# Patient Record
Sex: Male | Born: 1955 | Race: White | Hispanic: No | Smoking: Never smoker
Health system: Southern US, Community
[De-identification: ages and names within clinical notes are randomized; demographics above are authoritative.]

## PROBLEM LIST (undated history)

## (undated) DIAGNOSIS — E785 Hyperlipidemia, unspecified: Secondary | ICD-10-CM

## (undated) DIAGNOSIS — I10 Essential (primary) hypertension: Secondary | ICD-10-CM

## (undated) HISTORY — DX: Hyperlipidemia, unspecified: E78.5

## (undated) HISTORY — DX: Essential (primary) hypertension: I10

---

## 2002-09-06 ENCOUNTER — Ambulatory Visit (HOSPITAL_BASED_OUTPATIENT_CLINIC_OR_DEPARTMENT_OTHER): Admission: RE | Admit: 2002-09-06 | Discharge: 2002-09-06 | Payer: Self-pay | Admitting: General Surgery

## 2005-05-28 ENCOUNTER — Encounter: Admission: RE | Admit: 2005-05-28 | Discharge: 2005-05-28 | Payer: Self-pay | Admitting: Orthopedic Surgery

## 2005-09-20 ENCOUNTER — Ambulatory Visit: Payer: Self-pay | Admitting: Family Medicine

## 2005-11-21 ENCOUNTER — Ambulatory Visit: Payer: Self-pay | Admitting: Family Medicine

## 2006-07-11 ENCOUNTER — Ambulatory Visit: Payer: Self-pay | Admitting: Family Medicine

## 2006-07-11 LAB — CONVERTED CEMR LAB
ALT: 23 units/L (ref 0–40)
AST: 18 units/L (ref 0–37)
CO2: 30 meq/L (ref 19–32)
Cholesterol: 158 mg/dL (ref 0–200)
GFR calc non Af Amer: 69 mL/min
Glomerular Filtration Rate, Af Am: 83 mL/min/{1.73_m2}
Glucose, Bld: 100 mg/dL — ABNORMAL HIGH (ref 70–99)
LDL Cholesterol: 96 mg/dL (ref 0–99)
Lymphocytes Relative: 33.2 % (ref 12.0–46.0)
MCV: 98.2 fL (ref 78.0–100.0)
Neutrophils Relative %: 55.4 % (ref 43.0–77.0)
PSA: 0.71 ng/mL (ref 0.10–4.00)
Platelets: 278 10*3/uL (ref 150–400)
Potassium: 4.2 meq/L (ref 3.5–5.1)
TSH: 2.19 microintl units/mL (ref 0.35–5.50)
Triglyceride fasting, serum: 168 mg/dL — ABNORMAL HIGH (ref 0–149)
VLDL: 34 mg/dL (ref 0–40)

## 2006-08-11 ENCOUNTER — Ambulatory Visit: Payer: Self-pay | Admitting: Family Medicine

## 2006-09-09 ENCOUNTER — Ambulatory Visit: Payer: Self-pay | Admitting: Family Medicine

## 2007-11-18 ENCOUNTER — Encounter: Payer: Self-pay | Admitting: Family Medicine

## 2008-06-29 ENCOUNTER — Telehealth: Payer: Self-pay | Admitting: *Deleted

## 2008-09-19 ENCOUNTER — Telehealth: Payer: Self-pay | Admitting: *Deleted

## 2008-10-20 ENCOUNTER — Ambulatory Visit: Payer: Self-pay | Admitting: Family Medicine

## 2008-10-20 LAB — CONVERTED CEMR LAB
ALT: 22 units/L (ref 0–53)
AST: 24 units/L (ref 0–37)
Albumin: 4 g/dL (ref 3.5–5.2)
Basophils Relative: 0.6 % (ref 0.0–3.0)
CO2: 30 meq/L (ref 19–32)
Eosinophils Relative: 3.6 % (ref 0.0–5.0)
GFR calc non Af Amer: 84 mL/min
HDL: 30.7 mg/dL — ABNORMAL LOW (ref >39.0)
Hemoglobin: 15.5 g/dL (ref 13.0–17.0)
Ketones, urine, test strip: NEGATIVE
LDL Cholesterol: 93 mg/dL (ref 0–99)
Lymphocytes Relative: 27.2 % (ref 12.0–46.0)
MCHC: 35 g/dL (ref 30.0–36.0)
Monocytes Absolute: 0.7 10*3/uL (ref 0.1–1.0)
Monocytes Relative: 10 % (ref 3.0–12.0)
Neutro Abs: 4.1 10*3/uL (ref 1.4–7.7)
Nitrite: NEGATIVE
RDW: 11.6 % (ref 11.5–14.6)
Specific Gravity, Urine: 1.02
Total CHOL/HDL Ratio: 5.1
Triglycerides: 174 mg/dL — ABNORMAL HIGH (ref 0–149)
Urobilinogen, UA: 0.2

## 2008-10-27 ENCOUNTER — Ambulatory Visit: Payer: Self-pay | Admitting: Family Medicine

## 2008-10-27 DIAGNOSIS — M1710 Unilateral primary osteoarthritis, unspecified knee: Secondary | ICD-10-CM

## 2008-10-27 DIAGNOSIS — I10 Essential (primary) hypertension: Secondary | ICD-10-CM | POA: Insufficient documentation

## 2008-10-27 DIAGNOSIS — E785 Hyperlipidemia, unspecified: Secondary | ICD-10-CM | POA: Insufficient documentation

## 2008-11-08 ENCOUNTER — Ambulatory Visit: Payer: Self-pay | Admitting: Gastroenterology

## 2008-11-21 ENCOUNTER — Encounter: Payer: Self-pay | Admitting: Family Medicine

## 2008-11-21 ENCOUNTER — Ambulatory Visit: Payer: Self-pay | Admitting: Internal Medicine

## 2008-11-22 ENCOUNTER — Ambulatory Visit: Payer: Self-pay | Admitting: Gastroenterology

## 2009-11-03 ENCOUNTER — Ambulatory Visit: Payer: Self-pay | Admitting: Family Medicine

## 2009-11-03 DIAGNOSIS — N529 Male erectile dysfunction, unspecified: Secondary | ICD-10-CM

## 2009-11-03 LAB — CONVERTED CEMR LAB: Testosterone: 387 ng/dL (ref 350–890)

## 2010-11-06 NOTE — Assessment & Plan Note (Signed)
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  Nonsmoker, who comes in today for evaluation of one years history of decrease in sexual function.  He said no history of trauma has otherwise been well.  He takes lisinopril -- HCTZ 20 -- 25 daily for mild hypertension.  BP 140/98.  BP at home, normal.  Allergies: No Known Drug Allergies  Past History:  Past medical, surgical, family and social histories (including risk factors) reviewed, and no changes noted (except as noted below).  Past Medical History: Reviewed history from 10/27/2008 and no changes required. Hyperlipidemia Hypertension  Family History: Reviewed history from 10/27/2008 and no changes required. mom and dad.  No coronary disease, brother Jorja Loa in excellent health.  No coronary disease  Social History: Reviewed history from 10/27/2008 and no changes required. Occupation: Married Never Smoked Alcohol use-no Drug use-no Regular exercise-no  Review of Systems      See HPI  Physical Exam  General:  Well-developed,well-nourished,in no acute distress; alert,appropriate and cooperative throughout examination Genitalia:  Testes bilaterally descended without nodularity, tenderness or masses. No scrotal masses or lesions. No penis lesions or urethral discharge.   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 Take 1 tablet by mouth once a day 2)  Niaspan 500 Mg Tbcr (Niacin (antihyperlipidemic)) .... Take 1 tablet by mouth once a day ;cpx or ov to rf meds  Patient Instructions: 1)  I will call you and I get your lab work back. 2)  Remember this set up for y  annual exam in June

## 2010-12-18 ENCOUNTER — Ambulatory Visit
Admission: RE | Admit: 2010-12-18 | Discharge: 2010-12-18 | Disposition: A | Discharge status: Home / Self Care | Payer: BC Managed Care – PPO | Source: Ambulatory Visit | Attending: Orthopedic Surgery | Admitting: Orthopedic Surgery

## 2010-12-18 ENCOUNTER — Other Ambulatory Visit: Payer: Self-pay | Admitting: Orthopedic Surgery

## 2010-12-18 DIAGNOSIS — R52 Pain, unspecified: Secondary | ICD-10-CM

## 2010-12-18 DIAGNOSIS — Z01818 Encounter for other preprocedural examination: Secondary | ICD-10-CM

## 2010-12-20 ENCOUNTER — Ambulatory Visit (HOSPITAL_BASED_OUTPATIENT_CLINIC_OR_DEPARTMENT_OTHER)
Admission: RE | Admit: 2010-12-20 | Discharge: 2010-12-20 | Disposition: A | Discharge status: Home / Self Care | Payer: BC Managed Care – PPO | Source: Ambulatory Visit | Attending: Orthopedic Surgery | Admitting: Orthopedic Surgery

## 2010-12-20 DIAGNOSIS — Q740 Other congenital malformations of upper limb(s), including shoulder girdle: Secondary | ICD-10-CM | POA: Insufficient documentation

## 2010-12-20 DIAGNOSIS — M948X9 Other specified disorders of cartilage, unspecified sites: Secondary | ICD-10-CM | POA: Insufficient documentation

## 2010-12-21 ENCOUNTER — Ambulatory Visit: Payer: BC Managed Care – PPO | Attending: Radiation Oncology | Admitting: Radiation Oncology

## 2010-12-21 DIAGNOSIS — Z510 Encounter for antineoplastic radiation therapy: Secondary | ICD-10-CM | POA: Insufficient documentation

## 2010-12-21 DIAGNOSIS — Z79899 Other long term (current) drug therapy: Secondary | ICD-10-CM | POA: Insufficient documentation

## 2010-12-21 DIAGNOSIS — M6140 Other calcification of muscle, unspecified site: Secondary | ICD-10-CM | POA: Insufficient documentation

## 2010-12-21 DIAGNOSIS — I10 Essential (primary) hypertension: Secondary | ICD-10-CM | POA: Insufficient documentation

## 2010-12-27 NOTE — Op Note (Signed)
**Note De-Identified Arauz Obfuscation** NAMESHAYLON, ADEN NO.:  [id number]  MEDICAL RECORD NO.:  [id number]           PATIENT TYPE:  LOCATION:                                 FACILITY:  PHYSICIAN:  Loreta Ave, M.D.      DATE OF BIRTH:  DATE OF PROCEDURE:  12/20/2010 DATE OF DISCHARGE:                              OPERATIVE REPORT   PREOPERATIVE DIAGNOSES:  Left elbow previous distal biceps reimplantation, two-incision technique.  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  POSTOPERATIVE DIAGNOSES:  Left elbow previous distal biceps reimplantation, two-incision technique.  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  PROCEDURE:  Left elbow excision of functional synostosis with osteotomy and removal of all heterotopic bone from radius and ulna.  Restoration of full motion.  SURGEON:  Loreta Ave, MD  ASSISTANT:  Genene Churn. Barry Dienes, [place], present throughout the entire case and necessary for timely completion of procedure.  ANESTHESIA:  General.  BLOOD LOSS:  Minimal.  SPECIMENS:  None.  CULTURES:  None.  COMPLICATIONS:  None.  DRESSING:  Soft compressive.  TOURNIQUET TIME:  One hour and 15 minutes.  PROCEDURE:  The patient was brought to the operating room and placed on the operating table in supine position.  After adequate anesthesia had been obtained, a tourniquet was applied to the left arm.  Prepped and draped in the usual sterile fashion.  Exsanguinated with elevation of Esmarch, tourniquet was inflated to 250 mmHg.  He has full flexion and extension of his elbow.  Pronation 60 degrees, supination virtually 0. I utilized the dorsal incision proximal forearm over the supinator muscle.  Skin and subcutaneous tissue were divided.  The muscle was split exposing the area of synostosis.  I first removed all of  the heterotopic bone off the ulna down to the native shaft.  This improved motion to an extend, but there was a much larger area of heterotopic bone on the radius.  This was carefully exposed in its entirety and removed down to the level of the radial shaft throughout.  Care was taken to protect the radial nerve distally.  Once all bone had been completely removed, I got fluoroscopic views to confirm this.  I could then bring him through absolutely full motion with full flexion and extension of the elbow, full pronation and supination passively very easily.  At the time of excision of the heterotopic bone from the radius, the FiberWire suture, that was evident coming out where the heterotopic bone was, was pulled out and removed as his distal biceps repair was already intact and functional.  The wound was then thoroughly irrigated.  There was really not a place where I could apply a fat graft between the radius because of the motion between the two bones at that site.  The fascia supinator were closed with Vicryl and skin was closed with nylon.  Sterile compressive dressing was applied.  Tourniquet was deflated and removed.  Anesthesia was reversed.  Brought to **Note De-Identified Maes Obfuscation** the recovery room.  Tolerated the surgery well.  No complications.     Loreta Ave, M.D.     DFM/MEDQ  D:  12/20/2010  T:  12/21/2010  Job:  [id number]  Electronically Signed by Mckinley Jewel M.D. on 12/27/2010 11:48:44 AM

## 2011-02-22 NOTE — Op Note (Signed)
**Note De-Identified Swaminathan Obfuscation** NAME:  Justin [name], Justin [name]                            ACCOUNT NO.:  [id number]   MEDICAL RECORD NO.:  [id number]                   PATIENT TYPE:  AMB   LOCATION:  DSC                                  FACILITY:  [facility]   PHYSICIAN:  Jimmye Norman III, M.D.               DATE OF BIRTH:  11-Aug-1956   DATE OF PROCEDURE:  09/06/2002  DATE OF DISCHARGE:                                 OPERATIVE REPORT   PREOPERATIVE DIAGNOSIS:  Nonhealing posterior anal fissure.   POSTOPERATIVE DIAGNOSIS:  Nonhealing posterior anal fissure.   PROCEDURE:  1. Rigid sigmoidoscopy.  2. Anoscopy.  3. Examination under  anesthesia.  4. Lateral sphincterotomy.  5. Fissurectomy.   SURGEON:  Jimmye Norman, M.D.   ASSISTANT:  None.   ANESTHESIA:  General endotracheal anesthesia.   ESTIMATED BLOOD LOSS:  Less than 100 cc.   COMPLICATIONS:  None, condition stable.   INDICATIONS FOR PROCEDURE:  The patient is [name] 55 year old with [name] nonhealing  posterior anal fissure who has failed conservative management who now comes  in for definitive treatment.   FINDINGS:  The patient had [name] moderately scarred posterior anal fissure with  exposed but uninjured posterior anal sphincter muscle. The sphincter was cut  laterally along the left side of the superficial anal sphincter only.   OPERATION:  The patient was taken to the operating room and placed in the  supine position on the operating table. After an adequate endotracheal  anesthetic was administered, he was slipped into the jackknife prone  position. His buttocks was taped laterally and shaved around the anal area.   Initially the rigid sigmoidoscopy was done up to 22 cm. There were no polyps  or masses noted. We removed that and then subsequently did [name] digital  examination which demonstrated the fissure posteriorly, which was at the 12  o'clock position by description with an intact sphincter. We could see  exposed sphincter muscle in that area, but it was not injured  and not torn.   An anoscopy was subsequently performed which demonstrated the length of the  fissure measuring approximately 2 cm in length extending across the  superficial external sphincter. We excised the mucosal margins of the  excision and subsequently curetted lightly over the scar base, allowing for  bleeding. We then did [name] running, locking stitch of 3-0 chromic from the apex  of the cut mucosa out to the skin edge which was left open about 1cm in  size. This whole  area was about 3 cm after we excised the mucosal margin.   We subsequently on the left lateral aspect and midway between the anterior  and  posterior aspect of  the anal sphincter, we made [name] mucosal cut using [name]  #15 blade  and then subsequently isolated out the superficial external  sphincter at that area, undermining it with [name] hemostat clamp and then  cutting across using **Note De-Identified Boghosian Obfuscation** electrocautery. The mucosa was closed on top of that  using [name] running locking stitch of 3-0 chromic.   We irrigated with saline solution and then subsequently towels with  bupivacaine soaked with Gelfoam into the anal area for hemostasis and pain  control. We also injected circumferentially with half strength Marcaine into  the anal sphincter area. Once this was done, we put [name] dressing of fluff for  control of bleeding.                                               Kathrin Ruddy, M.D.    JW/MEDQ  D:  09/06/2002  T:  09/06/2002  Job:  [id number]

## 2011-03-07 ENCOUNTER — Other Ambulatory Visit: Payer: Self-pay | Admitting: Family Medicine

## 2011-03-07 NOTE — Telephone Encounter (Signed)
**Note De-identified Swalley Obfuscation** Time for an office visit 

## 2011-08-12 IMAGING — CT CT ELBOW*L* W/O CM
2 of 4 series · 4 of 14 positions shown, 5 images · non-contrast
Comparison: None.

CLINICAL DATA: The patient is unable to supinate hand.  Prior
distal biceps reattachment 4313.

CT OF THE LEFT ELBOW WITHOUT CONTRAST
TECHNIQUE: Multidetector CT imaging was performed according to the
standard protocol. Multiplanar CT image reconstructions were also
generated.

[Series 2: left elbow/bone · axial · 0.23mm/px · z∈[+80,+138]mm · 2 of 71 slices shown, 3 images]
[im 24/71  soft-tissue]
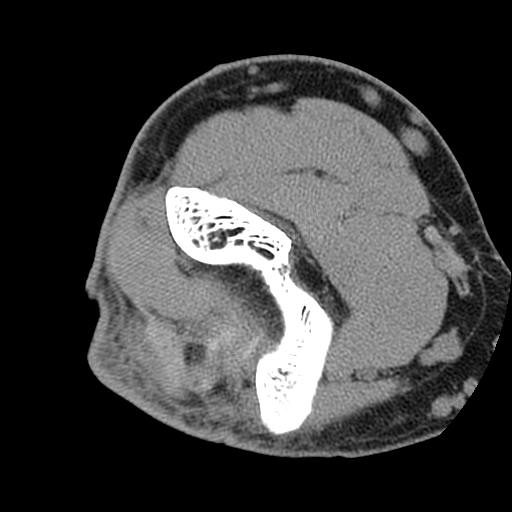
[im 24/71  bone]
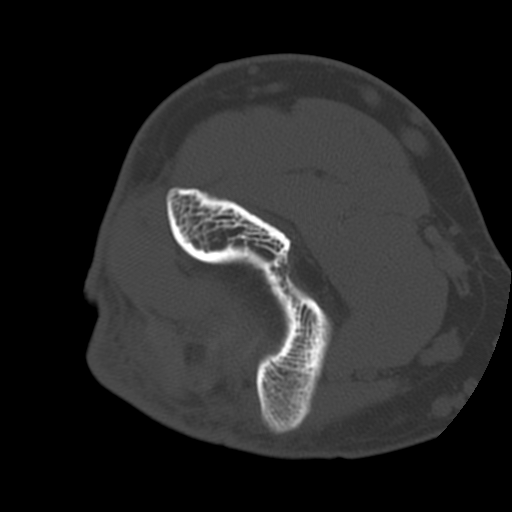
[im 47/71  bone]
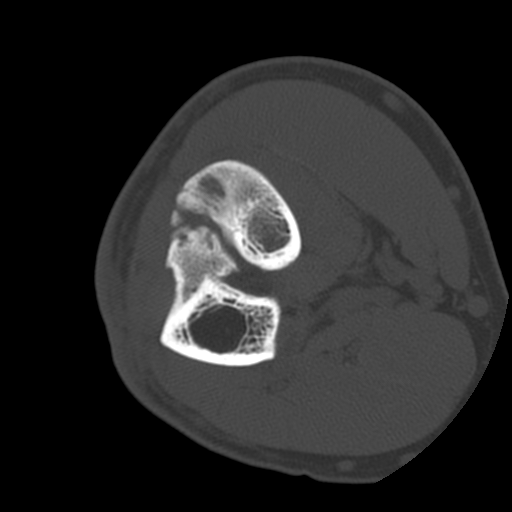

[Series 3: rt elbow · axial · 0.23mm/px · z∈[+80,+138]mm · 2 of 71 slices shown]
[im 24/71  bone]
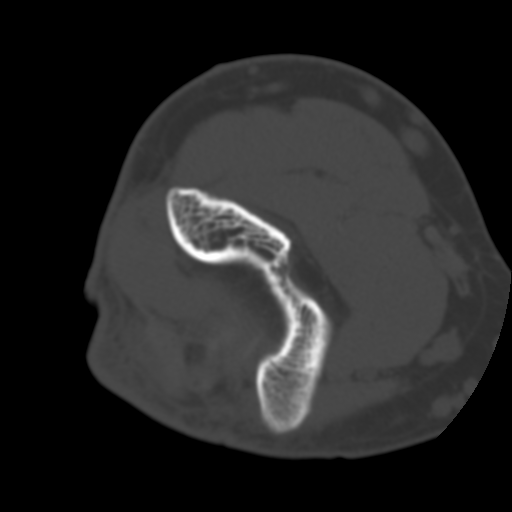
[im 47/71  bone]
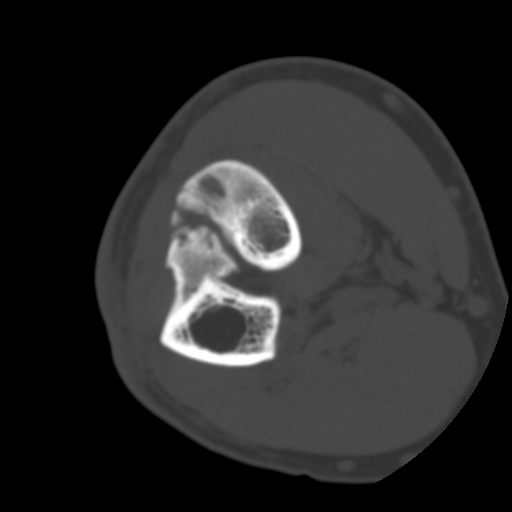

[4 of 14 positions shown; findings below may reference images not displayed]

FINDINGS: The biceps tendon is noted continuous down to the level
of the bicipital tuberosity, without findings of renal rupture.

There is subcutaneous edema overlying the olecranon and distal
triceps tendon, without overt olecranon bursitis.  No elbow
effusion is identified.

In the expected location of the supinator muscle, there is
pseudoarticulation of a large proximal ulnar metadiaphyseal spur-
like projection (which measures 1.9 x 1.3 x 2.9 cm and extends
laterally, posterior to the radius) and a large posterior radial
spur-like projection (which measures 1.6 x 1.9 x 3.1 cm).

There is also a small 6 mm intermediate fragment shown on image 53
of series 2.

The ulnar spur-like projection has a somewhat ground-glass matrix,
whereas the proximal radial projection has mixed slightly sclerotic
components proximally and distally along with a central lucent
component.

There is a screw track likely from bioabsorbable screw in the
vicinity of the radial tuberosity that extends through the radius
and assumes a curved course through the bony projection as shown on
images 29-34 of series 402.

No expansion of the ulnar nerve is identified.
IMPRESSION: 1. Very large spur-like projections from the radius and ulna in the
vicinity of the supinator muscle appear to pseudoarticulate and
likely prevent supination.  These may represent heterotopic
ossification which is fused and with the adjacent bones, given the
presence of the screw track through one of these projections.
Large enthesophytes are a differential diagnostic consideration.

## 2011-10-18 ENCOUNTER — Telehealth: Payer: Self-pay | Admitting: Family Medicine

## 2011-10-18 MED ORDER — LISINOPRIL-HYDROCHLOROTHIAZIDE 20-25 MG PO TABS: 1.0000 | ORAL_TABLET | Freq: Every day | ORAL | Status: DC

## 2011-10-18 NOTE — Telephone Encounter (Signed)
**Note De-Identified Tweedy Obfuscation** Needs new rx for Lisinopril sent to NEW PHARMACY------- [facility]---[address]---[place]. cpx is soon. Thanks.

## 2011-11-25 ENCOUNTER — Telehealth: Payer: Self-pay | Admitting: Family Medicine

## 2011-11-25 ENCOUNTER — Other Ambulatory Visit: Payer: BC Managed Care – PPO

## 2011-11-25 NOTE — Telephone Encounter (Signed)
**Note De-Identified Hollick Obfuscation** confidential Office Message [address] [place], [place] [address] p. [phone] f. [phone] To: Lacey Jensen Fax: [phone] From: [facility] Date/ Time: 11/24/2011 10:15 PM Taken By: Coralie Keens, CSR Caller: Molly Maduro Facility: not collected Patient: Justin, Le DOB: 09-09-1956 Phone: [phone] Reason for Call: See info below Regarding Appointment: Yes Appt Date: 11/25/2011 Appt Time: 9:15:00 AM Provider: Roderick Pee Reason: Details: lab appt cancellation; Caller advised to confirm cancellation when office opens Outcome: Instructed patient to call back on the next business day.

## 2011-11-26 ENCOUNTER — Other Ambulatory Visit (INDEPENDENT_AMBULATORY_CARE_PROVIDER_SITE_OTHER): Payer: BC Managed Care – PPO

## 2011-11-26 DIAGNOSIS — Z0000 Encounter for general adult medical examination without abnormal findings: Secondary | ICD-10-CM

## 2011-11-26 LAB — PSA: PSA: 0.66 ng/mL (ref 0.10–4.00)

## 2011-11-26 LAB — BASIC METABOLIC PANEL
BUN: 16 mg/dL (ref 6–23)
CO2: 27 mEq/L (ref 19–32)
Calcium: 9.3 mg/dL (ref 8.4–10.5)
Chloride: 103 mEq/L (ref 96–112)
Creatinine, Ser: 1 mg/dL (ref 0.4–1.5)
GFR: 83.84 mL/min (ref >60.00)

## 2011-11-26 LAB — CBC WITH DIFFERENTIAL/PLATELET
Basophils Relative: 0.3 % (ref 0.0–3.0)
Eosinophils Relative: 2.7 % (ref 0.0–5.0)
HCT: 41.9 % (ref 39.0–52.0)
Lymphocytes Relative: 27.1 % (ref 12.0–46.0)
Lymphs Abs: 1.5 10*3/uL (ref 0.7–4.0)
MCHC: 34.1 g/dL (ref 30.0–36.0)
MCV: 98.9 fl (ref 78.0–100.0)
Monocytes Absolute: 0.6 10*3/uL (ref 0.1–1.0)
Monocytes Relative: 9.8 % (ref 3.0–12.0)
Neutro Abs: 3.4 10*3/uL (ref 1.4–7.7)
Neutrophils Relative %: 60.1 % (ref 43.0–77.0)
Platelets: 227 10*3/uL (ref 150.0–400.0)
RBC: 4.23 Mil/uL (ref 4.22–5.81)
RDW: 12.9 % (ref 11.5–14.6)
WBC: 5.6 10*3/uL (ref 4.5–10.5)

## 2011-11-26 LAB — HEPATIC FUNCTION PANEL
AST: 31 U/L (ref 0–37)
Albumin: 3.8 g/dL (ref 3.5–5.2)
Bilirubin, Direct: 0.1 mg/dL (ref 0.0–0.3)
Total Bilirubin: 0.9 mg/dL (ref 0.3–1.2)

## 2011-11-26 LAB — POCT URINALYSIS DIPSTICK
Ketones, UA: NEGATIVE
Nitrite, UA: NEGATIVE
Protein, UA: NEGATIVE
pH, UA: 7.5

## 2011-11-26 LAB — LIPID PANEL
HDL: 42.1 mg/dL (ref >39.00)
VLDL: 27.6 mg/dL (ref 0.0–40.0)

## 2011-11-26 LAB — TSH: TSH: 1.84 u[IU]/mL (ref 0.35–5.50)

## 2011-11-29 ENCOUNTER — Encounter: Payer: Self-pay | Admitting: Family Medicine

## 2011-12-02 ENCOUNTER — Ambulatory Visit (INDEPENDENT_AMBULATORY_CARE_PROVIDER_SITE_OTHER): Payer: BC Managed Care – PPO | Admitting: Family Medicine

## 2011-12-02 ENCOUNTER — Encounter: Payer: Self-pay | Admitting: Family Medicine

## 2011-12-02 VITALS — BP 124/84 | Temp 98.7°F | Ht 74.5 in | Wt 276.0 lb

## 2011-12-02 DIAGNOSIS — I10 Essential (primary) hypertension: Secondary | ICD-10-CM

## 2011-12-02 MED ORDER — LISINOPRIL-HYDROCHLOROTHIAZIDE 20-25 MG PO TABS: 1.0000 | ORAL_TABLET | Freq: Every day | ORAL | Status: DC

## 2011-12-02 NOTE — Progress Notes (Signed)
**Note De-identified Hamrick Obfuscation**  **Note De-Identified Mell Obfuscation** Subjective:    Patient ID: Justin Le, male    DOB: September 05, 1956, 56 y.o.   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 Tetanus 2007 colonoscopy when he turns 52 normal   Review of Systems  Constitutional: Negative.   HENT: Negative.   Eyes: Negative.   Respiratory: Negative.   Cardiovascular: Negative.   Gastrointestinal: Negative.   Genitourinary: Negative.   Musculoskeletal: Negative.   Skin: Negative.   Neurological: Negative.   Hematological: Negative.   Psychiatric/Behavioral: Negative.        Objective:   Physical Exam  Constitutional: He is oriented to person, place, and time. He appears well-developed and well-nourished.  HENT:  Head: Normocephalic and atraumatic.  Right Ear: External ear normal.  Left Ear: External ear normal.  Nose: Nose normal.  Mouth/Throat: Oropharynx is clear and moist.  Eyes: Conjunctivae and EOM are normal. Pupils are equal, round, and reactive to light.  Neck: Normal range of motion. Neck supple. No JVD present. No tracheal deviation present. No thyromegaly present.  Cardiovascular: Normal rate, regular rhythm, normal heart sounds and intact distal pulses.  Exam reveals no gallop and no friction rub.   No murmur heard. Pulmonary/Chest: Effort normal and breath sounds normal. No stridor. No respiratory distress. He has no wheezes. He has no rales. He exhibits no tenderness.  Abdominal: Soft. Bowel sounds are normal. He exhibits no distension and no mass. There is no tenderness. There is no rebound and no guarding.  Genitourinary: Rectum normal, prostate normal and penis normal. Guaiac negative stool. No penile tenderness.    Musculoskeletal: Normal range of motion. He exhibits no edema and no tenderness.  Lymphadenopathy:    He has no cervical adenopathy.  Neurological: He is alert and oriented to person, place, and time. He has normal reflexes. No cranial nerve deficit. He exhibits normal muscle tone.  Skin: Skin is warm and dry. No rash noted. No erythema. No pallor.       Total body skin exam normal except for a skin tag on his back that were removed gratis  Psychiatric: He has a normal mood and affect. His behavior is normal. Judgment and thought content normal.          Assessment & Plan:  Healthy male  Hypertension continue current medication  Return in one year sooner if any problems

## 2011-12-02 NOTE — Patient Instructions (Signed)
**Note De-Identified Gorter Obfuscation** Continue your blood pressure medication daily and an aspirin tablet  Continue your exercise program  Followup in 1 year sooner if any problems

## 2012-07-14 ENCOUNTER — Encounter: Payer: Self-pay | Admitting: Family Medicine

## 2012-07-14 ENCOUNTER — Ambulatory Visit (INDEPENDENT_AMBULATORY_CARE_PROVIDER_SITE_OTHER): Payer: BC Managed Care – PPO | Admitting: Family Medicine

## 2012-07-14 VITALS — BP 120/82 | HR 91 | Temp 98.2°F | Resp 18 | Wt 282.0 lb

## 2012-07-14 DIAGNOSIS — R197 Diarrhea, unspecified: Secondary | ICD-10-CM | POA: Insufficient documentation

## 2012-07-14 NOTE — Patient Instructions (Signed)
**Note De-Identified Linderman Obfuscation** Tylenol for fever chills  Clear liquid diet until symptoms abate  Return when necessary

## 2012-07-14 NOTE — Progress Notes (Signed)
**Note De-identified Colasanti Obfuscation**  **Note De-Identified Eckroth Obfuscation** Subjective:    Patient ID: Justin Le, male    DOB: 02-13-1956, 56 y.o.   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 The diarrhea seems to be slowing down the last 48 hours.   Review of Systems General and gastro-review of systems otherwise negative    Objective:   Physical Exam Well-developed well nourished male no acute distress exam negative       Assessment & Plan:

## 2013-01-11 ENCOUNTER — Other Ambulatory Visit: Payer: Self-pay | Admitting: Family Medicine

## 2013-04-21 ENCOUNTER — Encounter: Payer: Self-pay | Admitting: Family Medicine

## 2013-04-21 ENCOUNTER — Ambulatory Visit (INDEPENDENT_AMBULATORY_CARE_PROVIDER_SITE_OTHER): Payer: BC Managed Care – PPO | Admitting: Family Medicine

## 2013-04-21 VITALS — BP 120/80 | Temp 98.8°F | Wt 298.0 lb

## 2013-04-21 DIAGNOSIS — I10 Essential (primary) hypertension: Secondary | ICD-10-CM

## 2013-04-21 DIAGNOSIS — L820 Inflamed seborrheic keratosis: Secondary | ICD-10-CM | POA: Insufficient documentation

## 2013-04-21 DIAGNOSIS — M1710 Unilateral primary osteoarthritis, unspecified knee: Secondary | ICD-10-CM

## 2013-04-21 DIAGNOSIS — E785 Hyperlipidemia, unspecified: Secondary | ICD-10-CM

## 2013-04-21 LAB — LIPID PANEL
Cholesterol: 177 mg/dL (ref 0–200)
HDL: 41.6 mg/dL (ref >39.00)
LDL Cholesterol: 102 mg/dL — ABNORMAL HIGH (ref 0–99)
Total CHOL/HDL Ratio: 4
VLDL: 33.6 mg/dL (ref 0.0–40.0)

## 2013-04-21 LAB — BASIC METABOLIC PANEL
BUN: 17 mg/dL (ref 6–23)
CO2: 27 mEq/L (ref 19–32)
Chloride: 104 mEq/L (ref 96–112)
Creatinine, Ser: 1.1 mg/dL (ref 0.4–1.5)
GFR: 71.6 mL/min (ref >60.00)
Sodium: 137 mEq/L (ref 135–145)

## 2013-04-21 LAB — CBC WITH DIFFERENTIAL/PLATELET
Basophils Absolute: 0 10*3/uL (ref 0.0–0.1)
Eosinophils Absolute: 0.4 10*3/uL (ref 0.0–0.7)
Eosinophils Relative: 3.5 % (ref 0.0–5.0)
HCT: 45.4 % (ref 39.0–52.0)
Lymphocytes Relative: 21.1 % (ref 12.0–46.0)
Lymphs Abs: 2.2 10*3/uL (ref 0.7–4.0)
MCHC: 34.3 g/dL (ref 30.0–36.0)
Monocytes Absolute: 0.7 10*3/uL (ref 0.1–1.0)
Neutro Abs: 7 10*3/uL (ref 1.4–7.7)
Neutrophils Relative %: 68.1 % (ref 43.0–77.0)
Platelets: 279 10*3/uL (ref 150.0–400.0)
WBC: 10.2 10*3/uL (ref 4.5–10.5)

## 2013-04-21 LAB — HEPATIC FUNCTION PANEL
ALT: 20 U/L (ref 0–53)
Albumin: 4.1 g/dL (ref 3.5–5.2)

## 2013-04-21 LAB — POCT URINALYSIS DIPSTICK
Bilirubin, UA: NEGATIVE
Glucose, UA: NEGATIVE
Spec Grav, UA: 1.025
Urobilinogen, UA: 0.2

## 2013-04-21 LAB — PSA: PSA: 0.77 ng/mL (ref 0.10–4.00)

## 2013-04-21 NOTE — Patient Instructions (Addendum)
**Note De-Identified Awtrey Obfuscation** Remember to wear sunscreens SPF 50+  Labs today  Schedule a physical exam in 4 weeks

## 2013-04-21 NOTE — Progress Notes (Signed)
**Note De-identified Suarez Obfuscation**  **Note De-Identified Prajapati Obfuscation** Subjective:    Patient ID: Elana Alm Ackman, male    DOB: [birth year], 57 y.o.   MRN: [id number]  HPI Bart is a 57 year old married male nonsmoker who comes in today for evaluation of a lesion on his shoulder and the concern about possible enlarged thyroid gland  He has a history of numerous freckles moles seborrheic keratosis he noticed a new one on his right shoulder about 3 weeks ago.  8 daughter-in-law mentioned that he looked like he might have an enlarged thyroid. He's never had a history of a large thyroid asymptomatic and family history negative for thyroid disease.   Review of Systems    review of systems negative last physical exam February 2013 Objective:   Physical Exam  Well-developed well-nourished tan male just got back from [place],,,,,,, examination of the skin totally shows numerous freckles moles A. Hemangioma skin tags and seborrheic keratosis. The lesion on his right shoulder peeled off with gentle pressure it's a seborrheic keratosis  Examination of the neck inspection was normal palpation normal no thyroid enlargement no palpable abnormal lymph nodes    Assessment & Plan:  Seborrheic keratosis removed  Normal thyroid exam

## 2013-04-23 ENCOUNTER — Telehealth: Payer: Self-pay | Admitting: *Deleted

## 2013-04-23 DIAGNOSIS — E039 Hypothyroidism, unspecified: Secondary | ICD-10-CM

## 2013-04-23 MED ORDER — LEVOTHYROXINE SODIUM 50 MCG PO TABS: 50.0000 ug | ORAL_TABLET | Freq: Every day | ORAL | Status: DC

## 2013-04-23 NOTE — Telephone Encounter (Signed)
**Note De-Identified Gerding Obfuscation** Message copied by Trenton Gammon on Fri Apr 23, 2013 10:11 AM ------      Message from: TODD, JEFFREY A      Created: Thu Apr 22, 2013  1:43 PM       Fleet Contras please call [name],,,,,,,,, his lab work all looked normal but he has developed hypothyroidism,,,,,,,,, have him start Synthroid 50 mcg dose one daily dispense 100........ 3 refills. Explained to him that he may not have a lot of symptoms because of a clotted early. And we will get a followup TSH level in 6 weeks after he starts his medicine ------

## 2013-05-21 ENCOUNTER — Ambulatory Visit (INDEPENDENT_AMBULATORY_CARE_PROVIDER_SITE_OTHER): Payer: BC Managed Care – PPO | Admitting: *Deleted

## 2013-05-21 DIAGNOSIS — Z111 Encounter for screening for respiratory tuberculosis: Secondary | ICD-10-CM

## 2013-05-21 DIAGNOSIS — Z0000 Encounter for general adult medical examination without abnormal findings: Secondary | ICD-10-CM

## 2013-05-24 ENCOUNTER — Encounter: Payer: BC Managed Care – PPO | Admitting: Family Medicine

## 2013-05-24 LAB — TB SKIN TEST: TB Skin Test: NEGATIVE

## 2013-07-16 ENCOUNTER — Encounter: Payer: Self-pay | Admitting: *Deleted

## 2013-07-16 DIAGNOSIS — E039 Hypothyroidism, unspecified: Secondary | ICD-10-CM | POA: Insufficient documentation

## 2013-08-10 ENCOUNTER — Telehealth: Payer: Self-pay | Admitting: Family Medicine

## 2013-08-10 NOTE — Telephone Encounter (Signed)
**Note De-identified Doster Obfuscation** lmom for pt to call back

## 2013-08-10 NOTE — Telephone Encounter (Signed)
**Note De-identified Maille Obfuscation** Okay to schedule

## 2013-08-10 NOTE — Telephone Encounter (Signed)
**Note De-Identified Spragg Obfuscation** And daytime okay with me

## 2013-08-10 NOTE — Telephone Encounter (Signed)
**Note De-Identified Pascuzzi Obfuscation** [place] Had to cancel cpe on thurs. Would like to know if you would work in sooner, maybe in dec, another cpe. appt?

## 2013-08-12 ENCOUNTER — Encounter: Payer: BC Managed Care – PPO | Admitting: Family Medicine

## 2013-08-12 ENCOUNTER — Other Ambulatory Visit: Payer: Self-pay

## 2013-08-13 NOTE — Telephone Encounter (Signed)
**Note De-Identified Scalzo Obfuscation** Pt has rsc to 09/13/13

## 2013-09-13 ENCOUNTER — Encounter: Payer: Self-pay | Admitting: Family Medicine

## 2013-09-13 ENCOUNTER — Ambulatory Visit (INDEPENDENT_AMBULATORY_CARE_PROVIDER_SITE_OTHER): Payer: BC Managed Care – PPO | Admitting: Family Medicine

## 2013-09-13 VITALS — BP 110/80 | Temp 98.6°F | Ht 74.75 in | Wt 310.0 lb

## 2013-09-13 DIAGNOSIS — E785 Hyperlipidemia, unspecified: Secondary | ICD-10-CM

## 2013-09-13 DIAGNOSIS — N529 Male erectile dysfunction, unspecified: Secondary | ICD-10-CM

## 2013-09-13 DIAGNOSIS — I10 Essential (primary) hypertension: Secondary | ICD-10-CM

## 2013-09-13 DIAGNOSIS — E039 Hypothyroidism, unspecified: Secondary | ICD-10-CM

## 2013-09-13 DIAGNOSIS — Z23 Encounter for immunization: Secondary | ICD-10-CM

## 2013-09-13 MED ORDER — SILDENAFIL CITRATE 50 MG PO TABS: 50.0000 mg | ORAL_TABLET | ORAL | Status: DC | PRN

## 2013-09-13 MED ORDER — LISINOPRIL-HYDROCHLOROTHIAZIDE 20-25 MG PO TABS: ORAL_TABLET | ORAL | Status: DC

## 2013-09-13 MED ORDER — LEVOTHYROXINE SODIUM 50 MCG PO TABS: 50.0000 ug | ORAL_TABLET | Freq: Every day | ORAL | Status: DC

## 2013-09-13 NOTE — Patient Instructions (Signed)
**Note De-Identified Fritchman Obfuscation** Continue your current medications  The website for Viagra [facility] [place] [email or web address]  I will call you about your lab work

## 2013-09-13 NOTE — Progress Notes (Signed)
**Note De-identified Minkoff Obfuscation**   **Note De-Identified Lardner Obfuscation** Subjective:    Patient ID: Justin Le, male    DOB: 03/02/1956, 57 y.o.   MRN: [id number]  HPI Justin Le is a 57 year old married,,,, second,,,,, nonsmoker who comes in today for annual physical examination because of a history of hypothyroidism hypertension and overweight  He takes Synthroid 50 mcg daily for hypothyroidism. He's been on it for about 4 months. TSH level originally was in the 16 range and he felt some asymptomatic  He takes Zestoretic 20-25 daily for hypertension BP 110/80  He gets routine eye care, dental care, colonoscopy and GI, vaccinations up-to-date  [place] 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   HENT: Negative.   Eyes: Negative.   Respiratory: Negative.   Cardiovascular: Negative.   Gastrointestinal: Negative.   Genitourinary: Negative.   Musculoskeletal: Negative.   Skin: Negative.   Neurological: Negative.   Psychiatric/Behavioral: Negative.        Objective:   Physical Exam  Nursing note and vitals reviewed. Constitutional: He is oriented to person, place, and time. He appears well-developed and well-nourished.  HENT:  Head: Normocephalic and atraumatic.  Right Ear: External ear normal.  Left Ear: External ear normal.  Nose: Nose normal.  Mouth/Throat: Oropharynx is clear and moist.  Eyes: Conjunctivae and EOM are normal. Pupils are equal, round, and reactive to light.  Neck: Normal range of motion. Neck supple. No JVD present. No tracheal deviation present. No thyromegaly present.  Cardiovascular: Normal rate, regular rhythm, normal heart sounds and intact distal pulses.  Exam reveals no gallop and no friction rub.   No murmur heard. Pulmonary/Chest: Effort normal and breath sounds normal. No stridor. No respiratory distress. He has no wheezes. He has no rales. He exhibits no tenderness.  Abdominal: Soft. Bowel sounds are normal. He exhibits no distension and no mass.  There is no tenderness. There is no rebound and no guarding.  Genitourinary: Rectum normal, prostate normal and penis normal. Guaiac negative stool. No penile tenderness.  Musculoskeletal: Normal range of motion. He exhibits no edema and no tenderness.  Lymphadenopathy:    He has no cervical adenopathy.  Neurological: He is alert and oriented to person, place, and time. He has normal reflexes. No cranial nerve deficit. He exhibits normal muscle tone.  Skin: Skin is warm and dry. No rash noted. No erythema. No pallor.  Total body skin exam normal  Psychiatric: He has a normal mood and affect. His behavior is normal. Judgment and thought content normal.          Assessment & Plan:  Healthy male  Hyper tension continue Zestoretic one daily  New onset of hypothyroidism Synthroid 50 mcg daily check TSH level  The ED Viagra 50 mg one half tab when necessary

## 2013-09-13 NOTE — Progress Notes (Signed)
**Note De-identified Malson Obfuscation** Pre visit review using our clinic review tool, if applicable. No additional management support is needed unless otherwise documented below in the visit note. 

## 2013-10-21 ENCOUNTER — Encounter: Payer: BC Managed Care – PPO | Admitting: Family Medicine

## 2014-09-23 ENCOUNTER — Other Ambulatory Visit: Payer: Self-pay | Admitting: Family Medicine

## 2014-12-18 ENCOUNTER — Other Ambulatory Visit: Payer: Self-pay | Admitting: Family Medicine

## 2014-12-26 ENCOUNTER — Other Ambulatory Visit: Payer: Self-pay | Admitting: Family Medicine

## 2015-01-31 ENCOUNTER — Other Ambulatory Visit: Payer: Self-pay | Admitting: Family Medicine

## 2015-01-31 ENCOUNTER — Telehealth: Payer: Self-pay | Admitting: Family Medicine

## 2015-01-31 MED ORDER — LEVOTHYROXINE SODIUM 50 MCG PO TABS: ORAL_TABLET | ORAL | Status: DC

## 2015-01-31 MED ORDER — LISINOPRIL-HYDROCHLOROTHIAZIDE 20-25 MG PO TABS: 1.0000 | ORAL_TABLET | Freq: Every day | ORAL | Status: DC

## 2015-01-31 NOTE — Telephone Encounter (Signed)
**Note De-identified Deskin Obfuscation** Rx sent 

## 2015-01-31 NOTE — Telephone Encounter (Signed)
**Note De-Identified Dawson Obfuscation** Pt has a cpx sch in nov 2016. Pt needs refills on levothyroxine and lisinopril-hctz #90 each w/refills [facility] [place]

## 2015-04-17 ENCOUNTER — Encounter: Payer: Self-pay | Admitting: Gastroenterology

## 2015-07-30 ENCOUNTER — Other Ambulatory Visit: Payer: Self-pay | Admitting: Family Medicine

## 2015-08-21 ENCOUNTER — Encounter: Payer: BC Managed Care – PPO | Admitting: Family Medicine

## 2015-09-20 ENCOUNTER — Telehealth: Payer: Self-pay | Admitting: Family Medicine

## 2015-09-20 MED ORDER — LISINOPRIL-HYDROCHLOROTHIAZIDE 20-25 MG PO TABS: 1.0000 | ORAL_TABLET | Freq: Every day | ORAL | Status: DC

## 2015-09-20 MED ORDER — LEVOTHYROXINE SODIUM 50 MCG PO TABS: ORAL_TABLET | ORAL | Status: DC

## 2015-09-20 NOTE — Telephone Encounter (Signed)
**Note De-Identified Febres Obfuscation** Pt request refill of the following: levothyroxine (SYNTHROID, LEVOTHROID) 50 MCG tablet ,  lisinopril-hydrochlorothiazide (PRINZIDE,ZESTORETIC) 20-25 MG per tablet   [facility] [place]

## 2016-01-09 ENCOUNTER — Ambulatory Visit (INDEPENDENT_AMBULATORY_CARE_PROVIDER_SITE_OTHER): Payer: BC Managed Care – PPO | Admitting: Family Medicine

## 2016-01-09 ENCOUNTER — Encounter: Payer: Self-pay | Admitting: Family Medicine

## 2016-01-09 VITALS — BP 120/90 | Temp 98.8°F | Ht 74.5 in | Wt 323.0 lb

## 2016-01-09 DIAGNOSIS — E663 Overweight: Secondary | ICD-10-CM

## 2016-01-09 DIAGNOSIS — E785 Hyperlipidemia, unspecified: Secondary | ICD-10-CM | POA: Diagnosis not present

## 2016-01-09 DIAGNOSIS — Z0000 Encounter for general adult medical examination without abnormal findings: Secondary | ICD-10-CM | POA: Insufficient documentation

## 2016-01-09 DIAGNOSIS — M1710 Unilateral primary osteoarthritis, unspecified knee: Secondary | ICD-10-CM

## 2016-01-09 DIAGNOSIS — N529 Male erectile dysfunction, unspecified: Secondary | ICD-10-CM

## 2016-01-09 DIAGNOSIS — M179 Osteoarthritis of knee, unspecified: Secondary | ICD-10-CM

## 2016-01-09 DIAGNOSIS — IMO0002 Reserved for concepts with insufficient information to code with codable children: Secondary | ICD-10-CM

## 2016-01-09 DIAGNOSIS — Z23 Encounter for immunization: Secondary | ICD-10-CM | POA: Diagnosis not present

## 2016-01-09 DIAGNOSIS — I10 Essential (primary) hypertension: Secondary | ICD-10-CM

## 2016-01-09 DIAGNOSIS — E039 Hypothyroidism, unspecified: Secondary | ICD-10-CM

## 2016-01-09 LAB — TSH: TSH: 3.2 u[IU]/mL (ref 0.35–4.50)

## 2016-01-09 LAB — CBC WITH DIFFERENTIAL/PLATELET
BASOS PCT: 0.4 % (ref 0.0–3.0)
Basophils Absolute: 0 10*3/uL (ref 0.0–0.1)
EOS PCT: 2.8 % (ref 0.0–5.0)
Eosinophils Absolute: 0.2 10*3/uL (ref 0.0–0.7)
HEMATOCRIT: 44.6 % (ref 39.0–52.0)
HEMOGLOBIN: 15.4 g/dL (ref 13.0–17.0)
LYMPHS PCT: 32.8 % (ref 12.0–46.0)
Lymphs Abs: 2.4 10*3/uL (ref 0.7–4.0)
MCHC: 34.4 g/dL (ref 30.0–36.0)
MCV: 96.5 fl (ref 78.0–100.0)
MONO ABS: 0.6 10*3/uL (ref 0.1–1.0)
MONOS PCT: 8.4 % (ref 3.0–12.0)
Neutro Abs: 4.1 10*3/uL (ref 1.4–7.7)
Neutrophils Relative %: 55.6 % (ref 43.0–77.0)
Platelets: 295 10*3/uL (ref 150.0–400.0)
RBC: 4.62 Mil/uL (ref 4.22–5.81)
RDW: 13.5 % (ref 11.5–15.5)
WBC: 7.4 10*3/uL (ref 4.0–10.5)

## 2016-01-09 LAB — POCT URINALYSIS DIPSTICK
BILIRUBIN UA: NEGATIVE
Blood, UA: NEGATIVE
GLUCOSE UA: NEGATIVE
KETONES UA: NEGATIVE
LEUKOCYTES UA: NEGATIVE
Nitrite, UA: NEGATIVE
Protein, UA: NEGATIVE
SPEC GRAV UA: 1.025
Urobilinogen, UA: 0.2
pH, UA: 5.5

## 2016-01-09 LAB — BASIC METABOLIC PANEL
BUN: 22 mg/dL (ref 6–23)
CHLORIDE: 102 meq/L (ref 96–112)
CO2: 29 mEq/L (ref 19–32)
Calcium: 9.9 mg/dL (ref 8.4–10.5)
Creatinine, Ser: 1.03 mg/dL (ref 0.40–1.50)
GFR: 78.9 mL/min (ref >60.00)
GLUCOSE: 90 mg/dL (ref 70–99)
POTASSIUM: 4.1 meq/L (ref 3.5–5.1)
SODIUM: 138 meq/L (ref 135–145)

## 2016-01-09 LAB — PSA: PSA: 1.02 ng/mL (ref 0.10–4.00)

## 2016-01-09 LAB — LIPID PANEL
CHOL/HDL RATIO: 5
CHOLESTEROL: 188 mg/dL (ref 0–200)
HDL: 39.2 mg/dL (ref >39.00)
LDL CALC: 110 mg/dL — AB (ref 0–99)
NonHDL: 149.15
Triglycerides: 194 mg/dL — ABNORMAL HIGH (ref 0.0–149.0)
VLDL: 38.8 mg/dL (ref 0.0–40.0)

## 2016-01-09 LAB — HEMOGLOBIN A1C: Hgb A1c MFr Bld: 5.6 % (ref 4.6–6.5)

## 2016-01-09 LAB — HEPATIC FUNCTION PANEL
ALT: 17 U/L (ref 0–53)
AST: 15 U/L (ref 0–37)
Albumin: 4.5 g/dL (ref 3.5–5.2)
Alkaline Phosphatase: 62 U/L (ref 39–117)
BILIRUBIN DIRECT: 0.2 mg/dL (ref 0.0–0.3)
BILIRUBIN TOTAL: 1.2 mg/dL (ref 0.2–1.2)
Total Protein: 7.6 g/dL (ref 6.0–8.3)

## 2016-01-09 MED ORDER — SILDENAFIL CITRATE 50 MG PO TABS: 50.0000 mg | ORAL_TABLET | ORAL | Status: DC | PRN

## 2016-01-09 MED ORDER — SILDENAFIL CITRATE 20 MG PO TABS: 20.0000 mg | ORAL_TABLET | Freq: Three times a day (TID) | ORAL | Status: DC

## 2016-01-09 MED ORDER — LEVOTHYROXINE SODIUM 50 MCG PO TABS: ORAL_TABLET | ORAL | Status: DC

## 2016-01-09 MED ORDER — LISINOPRIL-HYDROCHLOROTHIAZIDE 20-25 MG PO TABS: 1.0000 | ORAL_TABLET | Freq: Every day | ORAL | Status: DC

## 2016-01-09 NOTE — Progress Notes (Signed)
**Note De-identified Hynes Obfuscation**   **Note De-Identified Kon Obfuscation** Subjective:    Patient ID: Justin Le, male    DOB: 01/29/1956, 60 y.o.   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 He played football in high school and college. He is 75 inches tall and weighs 323 pounds. He got down to 310 pounds about a year and half ago.  He takes Synthroid 50 g daily for hypothyroidism, Zestoretic 20-25 daily for hypertension ...... BP 120/78 at home..... Viagra 50 mg when necessary for ED.  He recently [facility] because of bilateral knee pain. They told me had a partial tear in one of the cartilages in his left knee. It seems to be getting better on its own.  [place] and is considering retiring at the end of December 2017.  Vaccinations up-to-date tetanus booster 2007   Review of Systems  Constitutional: Negative.   HENT: Negative.   Eyes: Negative.   Respiratory: Negative.   Cardiovascular: Negative.   Gastrointestinal: Negative.   Endocrine: Negative.   Genitourinary: Negative.   Musculoskeletal: Negative.   Skin: Negative.   Allergic/Immunologic: Negative.   Neurological: Negative.   Hematological: Negative.   Psychiatric/Behavioral: Negative.        Objective:   Physical Exam  Constitutional: He is oriented to person, place, and time. He appears well-developed and well-nourished.  HENT:  Head: Normocephalic and atraumatic.  Right Ear: External ear normal.  Left Ear: External ear normal.  Nose: Nose normal.  Mouth/Throat: Oropharynx is clear and moist.  Eyes: Conjunctivae and EOM are normal. Pupils are equal, round, and reactive to light.  Neck: Normal range of motion. Neck supple. No JVD present. No tracheal deviation present. No thyromegaly present.  Cardiovascular: Normal rate,  regular rhythm, normal heart sounds and intact distal pulses.  Exam reveals no gallop and no friction rub.   No murmur heard. Pulmonary/Chest: Effort normal and breath sounds normal. No stridor. No respiratory distress. He has no wheezes. He has no rales. He exhibits no tenderness.  Abdominal: Soft. Bowel sounds are normal. He exhibits no distension and no mass. There is no tenderness. There is no rebound and no guarding.  Genitourinary: Rectum normal, prostate normal and penis normal. Guaiac negative stool. No penile tenderness.  Musculoskeletal: Normal range of motion. He exhibits no edema or tenderness.  Lymphadenopathy:    He has no cervical adenopathy.  Neurological: He is alert and oriented to person, place, and time. He has normal reflexes. He displays normal reflexes. No cranial nerve deficit. He exhibits normal muscle tone.  Skin: Skin is warm and dry. No rash noted. No erythema. No pallor.  Total body skin exam normal except for lesion left posterior ear appears to be an actinic keratosis advised to return for removal  Psychiatric: He has a normal mood and affect. His behavior is normal. Judgment and thought content normal.  Nursing note and vitals reviewed.         Assessment & Plan:  Obesity........ again discussed diet exercise and weight loss  Hypertension at goal........... continue current therapy  Hypothyroidism......... continue Synthroid  Mild ED..........Marland Kitchen. Viagra when necessary

## 2016-01-09 NOTE — Progress Notes (Signed)
**Note De-identified Fitzgibbons Obfuscation** Pre visit review using our clinic review tool, if applicable. No additional management support is needed unless otherwise documented below in the visit note. 

## 2016-01-09 NOTE — Patient Instructions (Signed)
**Note De-Identified Barasch Obfuscation** Continue current medications  Restart your diet and exercise program,,,,,,,,,, because of the cartilage tear in your left knee it's important to do things that are not strenuous on your knees,,,,,,,,, walking,,,,, swimming,,,,,,, stationary bike  Return in one year for general physical exam sooner if any problems  Return sometime this spring to remove the lesion on your left ear

## 2016-02-14 ENCOUNTER — Ambulatory Visit: Payer: BC Managed Care – PPO | Admitting: Family Medicine

## 2017-01-29 ENCOUNTER — Telehealth: Payer: Self-pay | Admitting: Family Medicine

## 2017-01-29 ENCOUNTER — Other Ambulatory Visit: Payer: Self-pay | Admitting: Family Medicine

## 2017-01-29 NOTE — Telephone Encounter (Signed)
**Note De-Identified Douthitt Obfuscation** Pt due for yearly.  Please help him to make an appt.  Medications filled for 90 days.  Thanks!!

## 2017-01-29 NOTE — Telephone Encounter (Signed)
**Note De-identified Stimmel Obfuscation** lmom for pt to call back

## 2017-01-29 NOTE — Telephone Encounter (Signed)
**Note De-identified Maddux Obfuscation** Sent to the pharmacy by e-scribe for 90 days.  Message sent to scheduling. 

## 2017-01-30 NOTE — Telephone Encounter (Signed)
**Note De-identified Nester Obfuscation** Spoke with pt; he will call back

## 2017-05-03 ENCOUNTER — Other Ambulatory Visit: Payer: Self-pay | Admitting: Family Medicine

## 2017-06-27 ENCOUNTER — Encounter: Payer: Self-pay | Admitting: Family Medicine

## 2017-09-11 ENCOUNTER — Telehealth: Payer: Self-pay | Admitting: Family Medicine

## 2017-09-11 NOTE — Telephone Encounter (Signed)
**Note De-Identified Schmaltz Obfuscation** Copied from CRM [phone]. Topic: Quick Communication - Rx Refill/Question >> Sep 11, 2017 11:31 AM Gerrianne ScalePayne, Rahn Lacuesta L wrote: Has the patient contacted their pharmacy? No.   (Agent: If no, request that the patient contact the pharmacy for the refill.)  Refill on Lisinopril/Hctz 20/25mg   Levothyroxine 50mcg   Preferred Pharm[facility] (with phone number or street name[facility] [place]   Agent: Please be advised that RX refills may take up to 3 business days. We ask that you follow-up with your pharmacy.

## 2017-09-11 NOTE — Telephone Encounter (Deleted)
**Note De-Identified Prada Obfuscation** Copied from CRM [id number]. Topic: Quick Communication - Rx Refill/Question >> Sep 11, 2017 11:33 AM Crist InfanteHarrald, Kathy J wrote: error

## 2017-09-11 NOTE — Telephone Encounter (Deleted)
**Note De-Identified Filla Obfuscation** Copied from CRM [phone]. Topic: Quick Communication - Rx Refill/Question >> Sep 11, 2017 11:31 AM Gerrianne ScalePayne, Angela L wrote: Has the patient contacted their pharmacy? No.   (Agent: If no, request that the patient contact the pharmacy for the refill.)  Refill on Lisinopril/Hctz 20/25mg   Levothyroxine 50mcg   Preferred Pharmacy (with phone number or street name[facility] [place]   Agent: Please be advised that RX refills may take up to 3 business days. We ask that you follow-up with your pharmacy. >> Sep 11, 2017 11:33 AM Crist InfanteHarrald, Kathy J wrote: Pt's call got dropped and he called back  Pt has a cpe on 11/19/17, but will need refills to get through. lisinopril-hydrochlorothiazide (PRINZIDE,ZESTORETIC) 20-25 MG tablet levothyroxine (SYNTHROID, LEVOTHROID) 50 MCG tablet  [id number] [place] [place] - [id number] [phone] (Phone) [phone] (Fax)

## 2017-09-12 ENCOUNTER — Other Ambulatory Visit: Payer: Self-pay

## 2017-09-12 MED ORDER — LISINOPRIL-HYDROCHLOROTHIAZIDE 20-25 MG PO TABS: 1.0000 | ORAL_TABLET | Freq: Every day | ORAL | 0 refills | Status: DC

## 2017-09-12 MED ORDER — LEVOTHYROXINE SODIUM 50 MCG PO TABS: ORAL_TABLET | ORAL | 0 refills | Status: DC

## 2017-09-12 NOTE — Telephone Encounter (Signed)
**Note De-Identified Repka Obfuscation** Pt Rx for levothyroxine and Lisinopril have been sent to pt requested pharmacy.

## 2017-11-19 ENCOUNTER — Ambulatory Visit (INDEPENDENT_AMBULATORY_CARE_PROVIDER_SITE_OTHER): Payer: BC Managed Care – PPO | Admitting: Family Medicine

## 2017-11-19 ENCOUNTER — Encounter: Payer: Self-pay | Admitting: Family Medicine

## 2017-11-19 VITALS — BP 110/88 | HR 74 | Temp 98.2°F | Ht 74.0 in | Wt 341.0 lb

## 2017-11-19 DIAGNOSIS — N529 Male erectile dysfunction, unspecified: Secondary | ICD-10-CM

## 2017-11-19 DIAGNOSIS — E039 Hypothyroidism, unspecified: Secondary | ICD-10-CM

## 2017-11-19 DIAGNOSIS — Z125 Encounter for screening for malignant neoplasm of prostate: Secondary | ICD-10-CM

## 2017-11-19 DIAGNOSIS — Z0000 Encounter for general adult medical examination without abnormal findings: Secondary | ICD-10-CM | POA: Diagnosis not present

## 2017-11-19 DIAGNOSIS — I10 Essential (primary) hypertension: Secondary | ICD-10-CM

## 2017-11-19 LAB — POCT URINALYSIS DIPSTICK
Bilirubin, UA: NEGATIVE
Glucose, UA: NEGATIVE
Ketones, UA: NEGATIVE
LEUKOCYTES UA: NEGATIVE
NITRITE UA: NEGATIVE
Protein, UA: NEGATIVE
UROBILINOGEN UA: 0.2 U/dL
pH, UA: 6 (ref 5.0–8.0)

## 2017-11-19 LAB — PSA: PSA: 0.95 ng/mL (ref 0.10–4.00)

## 2017-11-19 LAB — HEPATIC FUNCTION PANEL
ALBUMIN: 4 g/dL (ref 3.5–5.2)
ALT: 15 U/L (ref 0–53)
AST: 12 U/L (ref 0–37)
Alkaline Phosphatase: 65 U/L (ref 39–117)
Bilirubin, Direct: 0.1 mg/dL (ref 0.0–0.3)
Total Bilirubin: 0.7 mg/dL (ref 0.2–1.2)
Total Protein: 7.4 g/dL (ref 6.0–8.3)

## 2017-11-19 LAB — CBC WITH DIFFERENTIAL/PLATELET
BASOS ABS: 0 10*3/uL (ref 0.0–0.1)
Basophils Relative: 0.4 % (ref 0.0–3.0)
EOS ABS: 0.2 10*3/uL (ref 0.0–0.7)
Eosinophils Relative: 2.6 % (ref 0.0–5.0)
HCT: 44.5 % (ref 39.0–52.0)
Hemoglobin: 15.5 g/dL (ref 13.0–17.0)
LYMPHS ABS: 1.9 10*3/uL (ref 0.7–4.0)
Lymphocytes Relative: 28.3 % (ref 12.0–46.0)
MCHC: 34.7 g/dL (ref 30.0–36.0)
MCV: 97.8 fl (ref 78.0–100.0)
MONO ABS: 0.6 10*3/uL (ref 0.1–1.0)
MONOS PCT: 9.3 % (ref 3.0–12.0)
NEUTROS PCT: 59.4 % (ref 43.0–77.0)
Neutro Abs: 3.9 10*3/uL (ref 1.4–7.7)
Platelets: 296 10*3/uL (ref 150.0–400.0)
RBC: 4.55 Mil/uL (ref 4.22–5.81)
RDW: 13.2 % (ref 11.5–15.5)
WBC: 6.6 10*3/uL (ref 4.0–10.5)

## 2017-11-19 LAB — LIPID PANEL
Cholesterol: 160 mg/dL (ref 0–200)
HDL: 35.9 mg/dL — AB (ref >39.00)
LDL Cholesterol: 95 mg/dL (ref 0–99)
NONHDL: 123.85
Total CHOL/HDL Ratio: 4
Triglycerides: 143 mg/dL (ref 0.0–149.0)
VLDL: 28.6 mg/dL (ref 0.0–40.0)

## 2017-11-19 LAB — BASIC METABOLIC PANEL
BUN: 22 mg/dL (ref 6–23)
CALCIUM: 9.3 mg/dL (ref 8.4–10.5)
CO2: 28 meq/L (ref 19–32)
CREATININE: 1.05 mg/dL (ref 0.40–1.50)
Chloride: 104 mEq/L (ref 96–112)
GFR: 76.67 mL/min (ref >60.00)
Glucose, Bld: 95 mg/dL (ref 70–99)
Potassium: 3.9 mEq/L (ref 3.5–5.1)
Sodium: 139 mEq/L (ref 135–145)

## 2017-11-19 LAB — TSH: TSH: 2.29 u[IU]/mL (ref 0.35–4.50)

## 2017-11-19 MED ORDER — LEVOTHYROXINE SODIUM 50 MCG PO TABS
ORAL_TABLET | ORAL | 4 refills | Status: AC
Start: 2017-11-19 — End: ?

## 2017-11-19 MED ORDER — SILDENAFIL CITRATE 20 MG PO TABS: ORAL_TABLET | ORAL | 10 refills | Status: AC

## 2017-11-19 MED ORDER — LISINOPRIL-HYDROCHLOROTHIAZIDE 20-25 MG PO TABS: 1.0000 | ORAL_TABLET | Freq: Every day | ORAL | 4 refills | Status: AC

## 2017-11-19 NOTE — Patient Instructions (Signed)
**Note De-Identified Mozer Obfuscation** Carbohydrate free diet......... walk 30 minutes daily............  Purchase a [employer]  pump up digital blood pressure cuff,,,,,,,,,,,,, check your BP right arm sitting position twice daily for 3 weeks then call us if your blood pressure is not at goal............ BP goal 130/80 or less........... contact us on my chart  Labs today.......... I will call you if anything abnormal  Check with your insurance company to find out where you can get the shingles vaccine.  Return in one year for general physical examination sooner if any problems

## 2017-11-19 NOTE — Progress Notes (Signed)
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 BP today 110/88. Instructed him to get a new blood pressure cuff because his old ones over 829 years old. Monitor his blood pressure for 2 weeks and then we'll chat again. If his BP continues to be 88 diastolic then will alter his medication  Weight last year was 323 pounds now is 341 pounds. BMI is 41.00 obese class III. 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 We discussed diet exercise weight loss. He wants to try a carbohydrate free diet and begin an exercise program  He takes Synthroid 50 g daily because of a history of hypothyroidism  He takes generic Viagra when necessary for ED  He gets routine eye care, dental care, colonoscopy 2010 normal  Vaccinations up-to-date. Information given on shingles vaccine.  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  Justin Le has [place] and [name]. 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  14 point review of systems June otherwise negative  EKG just done because of a history of hypertension. EKG was normal and unchanged  BP 110/88 (BP Location: Left Arm, Patient Position: Sitting, Cuff Size: Large)   Pulse 74   Temp 98.2 F (36.8 C) (Oral)   Ht 6\' 2"  (1.88 m)   Wt (!) 341 lb (154.7 kg)   BMI 43.78 kg/m  Well-developed well-nourished overweight male no acute distress examination HEENT were negative neck was supple no adenopathy thyroid normal no carotid  bruits cardiopulmonary exam normal abdominal exam normal except for markedly obesity. Genitalia normal circumcised male rectum normal stool guaiac-negative prostate 1+ smooth nonnodular BPH. Extremities normal skin normal peripheral pulses normal except for 2 things #1 his right knee is swollen. He says he injured that many years ago playing football. He walks without pain. 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 He says is asymptomatic  #1 hypertension........ not at goal........ continue current meds check BP daily for 2 weeks call with the data if his blood pressures not at goal  #2 hypothyroidism.....Marland Kitchen. continue Synthroid check labs  #3 obesity............ diet exercise and weight loss outlined  #4 mild ED...........Marland Kitchen. refill generic Viagra  #5 degenerative joint disease..... Right knee......... asymptomatic therefore no therapy at this time

## 2018-11-23 ENCOUNTER — Encounter: Payer: BC Managed Care – PPO | Admitting: Family Medicine

## 2018-12-05 ENCOUNTER — Encounter: Payer: Self-pay | Admitting: Gastroenterology

## 2019-01-13 ENCOUNTER — Telehealth: Payer: Self-pay | Admitting: *Deleted

## 2019-01-13 NOTE — Telephone Encounter (Signed)
**Note De-Identified [place]way Obfuscation** Left message on machine for patient to schedule a TOC if needed.
